# Patient Record
Sex: Male | Born: 1985 | Hispanic: Yes | Marital: Married | Smoking: Former smoker
Health system: Southern US, Community
[De-identification: ages and names within clinical notes are randomized; demographics above are authoritative.]

## PROBLEM LIST (undated history)

## (undated) HISTORY — PX: OSTEOCHONDROMA EXCISION: SHX2137

---

## 2021-02-18 ENCOUNTER — Ambulatory Visit
Admission: RE | Admit: 2021-02-18 | Discharge: 2021-02-18 | Disposition: A | Discharge status: Home / Self Care | Payer: No Typology Code available for payment source | Source: Ambulatory Visit | Attending: Chiropractic Medicine | Admitting: Chiropractic Medicine

## 2021-02-18 ENCOUNTER — Other Ambulatory Visit: Payer: Self-pay | Admitting: Chiropractic Medicine

## 2021-02-18 DIAGNOSIS — R52 Pain, unspecified: Secondary | ICD-10-CM

## 2021-02-21 ENCOUNTER — Other Ambulatory Visit: Payer: Self-pay | Admitting: Chiropractic Medicine

## 2021-02-21 DIAGNOSIS — M545 Low back pain, unspecified: Secondary | ICD-10-CM

## 2021-02-25 ENCOUNTER — Ambulatory Visit
Admission: RE | Admit: 2021-02-25 | Discharge: 2021-02-25 | Disposition: A | Discharge status: Home / Self Care | Payer: Self-pay | Source: Ambulatory Visit | Attending: Chiropractic Medicine | Admitting: Chiropractic Medicine

## 2021-02-25 ENCOUNTER — Other Ambulatory Visit: Payer: Self-pay

## 2021-02-25 DIAGNOSIS — M545 Low back pain, unspecified: Secondary | ICD-10-CM

## 2021-03-29 ENCOUNTER — Ambulatory Visit: Payer: Self-pay | Admitting: Orthopedic Surgery

## 2021-04-09 NOTE — Pre-Procedure Instructions (Signed)
Surgical Instructions ? ? ? Your procedure is scheduled on Wednesday, April 5. ? Report to [facility] Main Entrance "A" at 6:30 A.M., then check in with the Admitting office. ? Call this number if you have problems the morning of surgery: ? [phone] ? ? If you have any questions prior to your surgery date call [phone]: Open Monday-Friday 8am-4pm ? ? ? Remember: ? Do not eat after midnight the night before your surgery ? ?You may drink clear liquids until 5:30 the morning of your surgery.   ?Clear liquids allowed are: Water, Non-Citrus Juices (without pulp), Carbonated Beverages, Clear Tea, Black Coffee ONLY (NO MILK, CREAM OR POWDERED CREAMER of any kind), and Gatorade ?  ? Take these medicines the morning of surgery with A SIP OF WATER:  ? ? ?As of today, STOP taking any Aspirin (unless otherwise instructed by your surgeon) Aleve, Naproxen, Ibuprofen, Motrin, Advil, Goody's, BC's, all herbal medications, fish oil, and all vitamins. ? ?         ?Do not wear jewelry or makeup ?Do not wear lotions, powders, perfumes/colognes, or deodorant. ?Do not shave 48 hours prior to surgery.  Men may shave face and neck. ?Do not bring valuables to the hospital. ?Do not wear nail polish, gel polish, artificial nails, or any other type of covering on natural nails (fingers and toes) ?If you have artificial nails or gel coating that need to be removed by a nail salon, please have this removed prior to surgery. Artificial nails or gel coating may interfere with anesthesia's ability to adequately monitor your vital signs. ? ?[facility] is not responsible for any belongings or valuables. .  ? ?Do NOT Smoke (Tobacco/Vaping)  24 hours prior to your procedure ? ?If you use a CPAP at night, you may bring your mask for your overnight stay. ?  ?Contacts, glasses, hearing aids, dentures or partials may not be worn into surgery, please bring cases for these belongings ?  ?For patients admitted to the hospital, discharge time will be  determined by your treatment team. ?  ?Patients discharged the day of surgery will not be allowed to drive home, and someone needs to stay with them for 24 hours. ? ? ?SURGICAL WAITING ROOM VISITATION ?Patients having surgery or a procedure in a hospital may have two support people. ?Children under the age of 66 must have an adult with them who is not the patient. ?They may stay in the waiting area during the procedure and may switch out with other visitors. If the patient needs to stay at the hospital during part of their recovery, the visitor guidelines for inpatient rooms apply. ? ?Please refer to the [facility] website for the visitor guidelines for Inpatients (after your surgery is over and you are in a regular room).  ? ? ? ? ? ?Special instructions:   ? ?Oral Hygiene is also important to reduce your risk of infection.  Remember - BRUSH YOUR TEETH THE MORNING OF SURGERY WITH YOUR REGULAR TOOTHPASTE ? ? ?[facility]- Preparing For Surgery ? ?Before surgery, you can play an important role. Because skin is not sterile, your skin needs to be as free of germs as possible. You can reduce the number of germs on your skin by washing with CHG (chlorahexidine gluconate) Soap before surgery.  CHG is an antiseptic cleaner which kills germs and bonds with the skin to continue killing germs even after washing.   ? ? ?Please do not use if you have an allergy to  CHG or antibacterial soaps. If your skin becomes reddened/irritated stop using the CHG.  ?Do not shave (including legs and underarms) for at least 48 hours prior to first CHG shower. It is OK to shave your face. ? ?Please follow these instructions carefully. ?  ? ? Shower the NIGHT BEFORE SURGERY and the MORNING OF SURGERY with CHG Soap.  ? If you chose to wash your hair, wash your hair first as usual with your normal shampoo. After you shampoo, rinse your hair and body thoroughly to remove the shampoo.  Then [employer] and genitals (private parts) with your normal  soap and rinse thoroughly to remove soap. ? ?After that Use CHG Soap as you would any other liquid soap. You can apply CHG directly to the skin and wash gently with a scrungie or a clean washcloth.  ? ?Apply the CHG Soap to your body ONLY FROM THE NECK DOWN.  Do not use on open wounds or open sores. Avoid contact with your eyes, ears, mouth and genitals (private parts). Wash Face and genitals (private parts)  with your normal soap.  ? ?Wash thoroughly, paying special attention to the area where your surgery will be performed. ? ?Thoroughly rinse your body with warm water from the neck down. ? ?DO NOT shower/wash with your normal soap after using and rinsing off the CHG Soap. ? ?Pat yourself dry with a CLEAN TOWEL. ? ?Wear CLEAN PAJAMAS to bed the night before surgery ? ?Place CLEAN SHEETS on your bed the night before your surgery ? ?DO NOT SLEEP WITH PETS. ? ? ?Day of Surgery: ? ?Take a shower with CHG soap. ?Wear Clean/Comfortable clothing the morning of surgery ?Do not apply any deodorants/lotions.   ?Remember to brush your teeth WITH YOUR REGULAR TOOTHPASTE. ? ? ? ?If you received a COVID test during your pre-op visit  it is requested that you wear a mask when out in public, stay away from anyone that may not be feeling well and notify your surgeon if you develop symptoms. If you have been in contact with anyone that has tested positive in the last 10 days please notify you surgeon. ? ?  ?Please read over the following fact sheets that you were given.  ? ?

## 2021-04-10 ENCOUNTER — Inpatient Hospital Stay (HOSPITAL_COMMUNITY)
Admission: RE | Admit: 2021-04-10 | Discharge: 2021-04-10 | Disposition: A | Discharge status: Home / Self Care | Payer: No Typology Code available for payment source | Source: Ambulatory Visit

## 2021-04-10 NOTE — Progress Notes (Signed)
Patient didn't come for PAT appointment this morning at 09:00 o'clock. This Probation officer called the patient but he can't speak very well [place]. He was at home at 09:25 o'clock. Dr. Rolena Infante office was called and notified. Our surgical scheduler was notified.  ?

## 2021-04-10 NOTE — Pre-Procedure Instructions (Addendum)
? Your procedure is scheduled on Wednesday, April 5. ? ? Report to [facility] Main Entrance "A" at 6:30 A.M., then check in with the Admitting office. ? Call this number if you have problems the morning of surgery: ? [phone] ? ? If you have any questions prior to your surgery date call [phone]: Open Monday-Friday 8am-4pm ? ? ? Remember: ? Do not eat after midnight the night before your surgery ? ?You may drink clear liquids until 5:30 the morning of your surgery.   ?Clear liquids allowed are: Water, Non-Citrus Juices (without pulp), Carbonated Beverages, Clear Tea, Black Coffee ONLY (NO MILK, CREAM OR POWDERED CREAMER of any kind), and Gatorade ?  ? Take these medicines the morning of surgery with A SIP OF WATER: NONE ? ? ?As of today, STOP taking any Aspirin (unless otherwise instructed by your surgeon) Aleve, Naproxen, Ibuprofen, Motrin, Advil, Goody's, BC's, all herbal medications, fish oil, and all vitamins. ? ? ? The day of surgery: ?         ?Do not wear jewelry ?Do not wear lotions, powders, colognes, or deodorant. ?Men may shave face and neck. ?Do not bring valuables to the hospital. ? ? ?[facility] is not responsible for any belongings or valuables. .  ? ?Do NOT Smoke (Tobacco/Vaping)  24 hours prior to your procedure ? ?If you use a CPAP at night, you may bring your mask for your overnight stay. ?  ?Contacts, glasses, hearing aids, dentures or partials may not be worn into surgery, please bring cases for these belongings ?  ?For patients admitted to the hospital, discharge time will be determined by your treatment team. ?  ?Patients discharged the day of surgery will not be allowed to drive home, and someone needs to stay with them for 24 hours. ? ? ?SURGICAL WAITING ROOM VISITATION ?Patients having surgery or a procedure in a hospital may have two support people. ?Children under the age of 65 must have an adult with them who is not the patient. ?They may stay in the waiting area during the  procedure and may switch out with other visitors. If the patient needs to stay at the hospital during part of their recovery, the visitor guidelines for inpatient rooms apply. ? ?Please refer to the [facility] website for the visitor guidelines for Inpatients (after your surgery is over and you are in a regular room).  ? ? ?Special instructions:   ? ?Oral Hygiene is also important to reduce your risk of infection.  Remember - BRUSH YOUR TEETH THE MORNING OF SURGERY WITH YOUR REGULAR TOOTHPASTE ? ? ?[facility]- Preparing For Surgery ? ?Before surgery, you can play an important role. Because skin is not sterile, your skin needs to be as free of germs as possible. You can reduce the number of germs on your skin by washing with CHG (chlorahexidine gluconate) Soap before surgery.  CHG is an antiseptic cleaner which kills germs and bonds with the skin to continue killing germs even after washing.   ? ? ?Please do not use if you have an allergy to CHG or antibacterial soaps. If your skin becomes reddened/irritated stop using the CHG.  ?Do not shave (including legs and underarms) for at least 48 hours prior to first CHG shower. It is OK to shave your face. ? ?Please follow these instructions carefully. ?  ? ? Shower the NIGHT BEFORE SURGERY and the MORNING OF SURGERY with CHG Soap.  ? If you chose to wash your hair, wash  your hair first as usual with your normal shampoo. After you shampoo, rinse your hair and body thoroughly to remove the shampoo.  Then [employer] and genitals (private parts) with your normal soap and rinse thoroughly to remove soap. ? ?After that Use CHG Soap as you would any other liquid soap. You can apply CHG directly to the skin and wash gently with a scrungie or a clean washcloth.  ? ?Apply the CHG Soap to your body ONLY FROM THE NECK DOWN.  Do not use on open wounds or open sores. Avoid contact with your eyes, ears, mouth and genitals (private parts). Wash Face and genitals (private parts)  with your  normal soap.  ? ?Wash thoroughly, paying special attention to the area where your surgery will be performed. ? ?Thoroughly rinse your body with warm water from the neck down. ? ?DO NOT shower/wash with your normal soap after using and rinsing off the CHG Soap. ? ?Pat yourself dry with a CLEAN TOWEL. ? ?Wear CLEAN PAJAMAS to bed the night before surgery ? ?Place CLEAN SHEETS on your bed the night before your surgery ? ?DO NOT SLEEP WITH PETS. ? ? ?Day of Surgery: ? ?Take a shower with CHG soap. ?Wear Clean/Comfortable clothing the morning of surgery ?Do not apply any deodorants/lotions.   ?Remember to brush your teeth WITH YOUR REGULAR TOOTHPASTE. ? ? ? ?If you received a COVID test during your pre-op visit  it is requested that you wear a mask when out in public, stay away from anyone that may not be feeling well and notify your surgeon if you develop symptoms. If you have been in contact with anyone that has tested positive in the last 10 days please notify you surgeon. ? ?  ?Please read over the following fact sheets that you were given.  ? ?

## 2021-04-13 ENCOUNTER — Other Ambulatory Visit (HOSPITAL_COMMUNITY)
Admission: RE | Admit: 2021-04-13 | Discharge: 2021-04-13 | Disposition: A | Discharge status: Home / Self Care | Payer: No Typology Code available for payment source | Source: Ambulatory Visit

## 2021-04-15 ENCOUNTER — Other Ambulatory Visit: Payer: Self-pay

## 2021-04-15 ENCOUNTER — Encounter (HOSPITAL_COMMUNITY): Payer: Self-pay

## 2021-04-15 ENCOUNTER — Encounter (HOSPITAL_COMMUNITY)
Admission: RE | Admit: 2021-04-15 | Discharge: 2021-04-15 | Disposition: A | Discharge status: Home / Self Care | Payer: No Typology Code available for payment source | Source: Ambulatory Visit | Attending: Orthopedic Surgery | Admitting: Orthopedic Surgery

## 2021-04-15 VITALS — BP 132/73 | HR 84 | Temp 98.6°F | Resp 17 | Ht 75.0 in | Wt 225.2 lb

## 2021-04-15 DIAGNOSIS — Z01812 Encounter for preprocedural laboratory examination: Secondary | ICD-10-CM | POA: Insufficient documentation

## 2021-04-15 DIAGNOSIS — Z01818 Encounter for other preprocedural examination: Secondary | ICD-10-CM

## 2021-04-15 LAB — SURGICAL PCR SCREEN
MRSA, PCR: NEGATIVE
Staphylococcus aureus: NEGATIVE

## 2021-04-15 LAB — CBC
HCT: 42.8 % (ref 39.0–52.0)
Hemoglobin: 14.2 g/dL (ref 13.0–17.0)
MCH: 30.4 pg (ref 26.0–34.0)
MCHC: 33.2 g/dL (ref 30.0–36.0)
MCV: 91.6 fL (ref 80.0–100.0)
Platelets: 209 10*3/uL (ref 150–400)
RBC: 4.67 MIL/uL (ref 4.22–5.81)
RDW: 12.3 % (ref 11.5–15.5)
WBC: 9.4 10*3/uL (ref 4.0–10.5)
nRBC: 0 % (ref 0.0–0.2)

## 2021-04-15 NOTE — Progress Notes (Signed)
PCP - denies ?Cardiologist - denies ? ?PPM/ICD - n/a ? ?Chest x-ray - n/a ?EKG - n/a ?Stress Test - denies ?ECHO - denies ?Cardiac Cath - denies ? ?Sleep Study - denies ?CPAP - denies ? ?Blood Thinner Instructions: n/a ?Aspirin Instructions: n/a ? ?ERAS Protcol -clear liquids until 0530 DOS ?PRE-SURGERY Ensure or G2- none ordered ? ?COVID TEST- n/a ? ?Anesthesia review: no ? ?Patient denies shortness of breath, fever, cough and chest pain at PAT appointment ? ? ?All instructions explained to the patient, with a verbal understanding of the material. Patient agrees to go over the instructions while at home for a better understanding. Patient also instructed to self quarantine after being tested for COVID-19. The opportunity to ask questions was provided. ? ? ?

## 2021-04-16 NOTE — H&P (Signed)
? ? ?Chief Complaint: ?Back and radicular leg pain ? ?History: ?Eric Washington presents today severe back buttock and bilateral radicular leg pain since 02/12/2021. Patient states their pain level is 8-9/10 which significantly impairs activities of daily living as well as overall quality of life.  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   Patient failed to improve with ESI.  Patient has neurologic deficits and pain and as a result we elected to move forward with surgery ? ?No past medical history on file. ? ?No Known Allergies ? ?No current facility-administered medications on file prior to encounter.  ? ?No current outpatient medications on file prior to encounter.  ? ? ?Physical Exam: ?There were no vitals filed for this visit. ?There is no height or weight on file to calculate BMI. ? ? Fareed is a pleasant individual, who appears younger than their stated age. ? ?He is alert and orientated ?3. ? ?No shortness of breath, chest pain. ? ?Lungs clear to auscultation bilaterally ? ?Cardiac: Regular rate and rhythm. No rubs gallops murmurs. ? ?Abdomen is soft and non-tender, negative loss of bowel and bladder control, no rebound tenderness. ? ?Negative: skin lesions abrasions contusions ? ?Peripheral pulses: 2+ dorsalis pedis/posterior tibialis pulses bilaterally. ?LE compartments are: Soft and nontender. ? ?Gait pattern: Altered gait pattern due to bilateral lower extremity generalized weakness ? ?Assistive devices: Walker ? ?Neuro: In the seated position: Bilateral :5/5 motor strength in the hip flexor, quad, and hamstring. 2/5 EHL/tibialis anterior strength bilaterally. 4/5 gastrocnemius strength bilaterally. Numbness and dysesthesias predominantly in the L3 and L4 dermatome bilaterally. Negative Babinski test, negative straight leg raise test. No clonus. ? ?Musculoskeletal: Moderate to severe back pain radiating into both lower extremities. Limited range of motion of his  lumbar spine due to severe pain. ? ?Image: ? X-rays of the lumbar spine demonstrate normal sagittal alignment with no spondylolisthesis or scoliosis. ? 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 Moderate spinal canal stenosis at L4-5. No fracture or abnormal marrow signal change. ? ? ?A/P: ?Summary: Eric Washington presents today severe back buttock and bilateral radicular leg pain since 02/12/2021. Patient states their pain level is 8-9/10 which significantly impairs activities of daily living as well as overall quality of life. ? ?Diagnosis: Eric Washington is a very pleasant 36 year old gentleman who was in his usual state of good health until he unfortunately injured himself after exercising. Patient presents today because of severe back and radicular bilateral leg pain. He has a interpreter as well as his friend. Patient is a Spanish-speaking individual. ? ?I have gone over the MRI and clinical findings with the patient. He has a large disc herniation at L3-4 which is causing central and lateral recess stenosis affecting the L4 and L5 nerve root. He has neurological deficits on both motor and sensory testing bilaterally in these distributions. Fortunately, he does not show signs or symptoms of a cauda equina syndrome. ? ?Risks and benefits of decompression/discectomy: Infection, bleeding, death, stroke, paralysis, ongoing or worse pain, need for additional surgery, leak of spinal fluid, adjacent segment degeneration requiring additional surgery, post-operative hematoma formation that can result in neurological compromise and the need for urgent/emergent re-operation. Loss in bowel and bladder control. Injury to major vessels that could result in the need for urgent abdominal surgery to stop bleeding. Risk of deep venous thrombosis (DVT) and the need for additional treatment. Recurrent disc herniation resulting in the  need  for revision surgery, which could include fusion surgery (utilizing instrumentation such as pedicle screws and intervertebral cages). ?Additional risk: If instrumentation is used there is a risk of migration, or breakage of that hardware that could require additional surgery. ?

## 2021-04-16 NOTE — Progress Notes (Signed)
LM on machine of time change, arrival 0800  ?

## 2021-04-17 ENCOUNTER — Encounter (HOSPITAL_COMMUNITY)
Admission: RE | Disposition: A | Discharge status: Home / Self Care | Payer: Self-pay | Source: Home / Self Care | Attending: Orthopedic Surgery

## 2021-04-17 ENCOUNTER — Ambulatory Visit (HOSPITAL_BASED_OUTPATIENT_CLINIC_OR_DEPARTMENT_OTHER): Payer: No Typology Code available for payment source | Admitting: Certified Registered Nurse Anesthetist

## 2021-04-17 ENCOUNTER — Encounter (HOSPITAL_COMMUNITY): Payer: Self-pay | Admitting: Orthopedic Surgery

## 2021-04-17 ENCOUNTER — Ambulatory Visit (HOSPITAL_COMMUNITY): Payer: No Typology Code available for payment source

## 2021-04-17 ENCOUNTER — Other Ambulatory Visit: Payer: Self-pay

## 2021-04-17 ENCOUNTER — Observation Stay (HOSPITAL_COMMUNITY)
Admission: RE | Admit: 2021-04-17 | Discharge: 2021-04-18 | Disposition: A | Discharge status: Home / Self Care | Payer: No Typology Code available for payment source | Attending: Orthopedic Surgery | Admitting: Orthopedic Surgery

## 2021-04-17 ENCOUNTER — Ambulatory Visit (HOSPITAL_COMMUNITY): Payer: No Typology Code available for payment source | Admitting: Certified Registered Nurse Anesthetist

## 2021-04-17 DIAGNOSIS — M5116 Intervertebral disc disorders with radiculopathy, lumbar region: Secondary | ICD-10-CM | POA: Insufficient documentation

## 2021-04-17 DIAGNOSIS — M48061 Spinal stenosis, lumbar region without neurogenic claudication: Principal | ICD-10-CM | POA: Diagnosis present

## 2021-04-17 HISTORY — PX: LUMBAR LAMINECTOMY/DECOMPRESSION MICRODISCECTOMY: SHX5026

## 2021-04-17 LAB — HEMOGLOBIN A1C
Hgb A1c MFr Bld: 5.7 % — ABNORMAL HIGH (ref 4.8–5.6)
Mean Plasma Glucose: 116.89 mg/dL

## 2021-04-17 SURGERY — LUMBAR LAMINECTOMY/DECOMPRESSION MICRODISCECTOMY 1 LEVEL
Anesthesia: General | Site: Spine Lumbar

## 2021-04-17 MED ORDER — LIDOCAINE 2% (20 MG/ML) 5 ML SYRINGE
INTRAMUSCULAR | Status: AC
  Filled 2021-04-17: qty 5

## 2021-04-17 MED ORDER — ACETAMINOPHEN 650 MG RE SUPP: 650.0000 mg | RECTAL | Status: DC | PRN

## 2021-04-17 MED ORDER — OXYCODONE HCL 5 MG PO TABS
5.0000 mg | ORAL_TABLET | ORAL | Status: DC | PRN
  Administered 2021-04-18: 5 mg via ORAL
  Filled 2021-04-17: qty 1

## 2021-04-17 MED ORDER — POLYETHYLENE GLYCOL 3350 17 G PO PACK: 17.0000 g | PACK | Freq: Every day | ORAL | Status: DC | PRN

## 2021-04-17 MED ORDER — GABAPENTIN 300 MG PO CAPS
300.0000 mg | ORAL_CAPSULE | Freq: Three times a day (TID) | ORAL | Status: DC
  Administered 2021-04-17 – 2021-04-18 (×3): 300 mg via ORAL
  Filled 2021-04-17 (×3): qty 1

## 2021-04-17 MED ORDER — ACETAMINOPHEN 160 MG/5ML PO SOLN: 1000.0000 mg | Freq: Once | ORAL | Status: DC | PRN

## 2021-04-17 MED ORDER — FLEET ENEMA 7-19 GM/118ML RE ENEM: 1.0000 | ENEMA | Freq: Once | RECTAL | Status: DC | PRN

## 2021-04-17 MED ORDER — MIDAZOLAM HCL 2 MG/2ML IJ SOLN
INTRAMUSCULAR | Status: DC | PRN
  Administered 2021-04-17: 2 mg via INTRAVENOUS

## 2021-04-17 MED ORDER — CEFAZOLIN SODIUM-DEXTROSE 2-4 GM/100ML-% IV SOLN
2.0000 g | INTRAVENOUS | Status: AC
  Administered 2021-04-17: 2 g via INTRAVENOUS

## 2021-04-17 MED ORDER — HYDROMORPHONE HCL 1 MG/ML IJ SOLN: 1.0000 mg | INTRAMUSCULAR | Status: DC | PRN

## 2021-04-17 MED ORDER — ORAL CARE MOUTH RINSE: 15.0000 mL | Freq: Once | OROMUCOSAL | Status: DC

## 2021-04-17 MED ORDER — ROCURONIUM BROMIDE 10 MG/ML (PF) SYRINGE
PREFILLED_SYRINGE | INTRAVENOUS | Status: AC
  Filled 2021-04-17: qty 10

## 2021-04-17 MED ORDER — CHLORHEXIDINE GLUCONATE 0.12 % MT SOLN: 15.0000 mL | Freq: Once | OROMUCOSAL | Status: DC

## 2021-04-17 MED ORDER — DEXAMETHASONE 4 MG PO TABS
4.0000 mg | ORAL_TABLET | Freq: Four times a day (QID) | ORAL | Status: DC
  Administered 2021-04-17: 4 mg via ORAL
  Filled 2021-04-17: qty 1

## 2021-04-17 MED ORDER — PHENOL 1.4 % MT LIQD: 1.0000 | OROMUCOSAL | Status: DC | PRN

## 2021-04-17 MED ORDER — FENTANYL CITRATE (PF) 250 MCG/5ML IJ SOLN
INTRAMUSCULAR | Status: DC | PRN
  Administered 2021-04-17: 100 ug via INTRAVENOUS
  Administered 2021-04-17 (×3): 50 ug via INTRAVENOUS

## 2021-04-17 MED ORDER — DEXAMETHASONE SODIUM PHOSPHATE 10 MG/ML IJ SOLN
INTRAMUSCULAR | Status: AC
  Filled 2021-04-17: qty 1

## 2021-04-17 MED ORDER — LIDOCAINE 2% (20 MG/ML) 5 ML SYRINGE
INTRAMUSCULAR | Status: DC | PRN
Start: 2021-04-17 — End: 2021-04-17
  Administered 2021-04-17: 60 mg via INTRAVENOUS

## 2021-04-17 MED ORDER — ROCURONIUM BROMIDE 10 MG/ML (PF) SYRINGE
PREFILLED_SYRINGE | INTRAVENOUS | Status: DC | PRN
Start: 2021-04-17 — End: 2021-04-17
  Administered 2021-04-17: 80 mg via INTRAVENOUS
  Administered 2021-04-17 (×2): 20 mg via INTRAVENOUS

## 2021-04-17 MED ORDER — DEXAMETHASONE SODIUM PHOSPHATE 10 MG/ML IJ SOLN
INTRAMUSCULAR | Status: DC | PRN
  Administered 2021-04-17: 10 mg via INTRAVENOUS

## 2021-04-17 MED ORDER — ACETAMINOPHEN 325 MG PO TABS
650.0000 mg | ORAL_TABLET | ORAL | Status: DC | PRN
  Administered 2021-04-17 – 2021-04-18 (×2): 650 mg via ORAL
  Filled 2021-04-17 (×3): qty 2

## 2021-04-17 MED ORDER — SODIUM CHLORIDE 0.9% FLUSH
3.0000 mL | Freq: Two times a day (BID) | INTRAVENOUS | Status: DC
  Administered 2021-04-17: 3 mL via INTRAVENOUS

## 2021-04-17 MED ORDER — 0.9 % SODIUM CHLORIDE (POUR BTL) OPTIME
TOPICAL | Status: DC | PRN
  Administered 2021-04-17 (×2): 1000 mL

## 2021-04-17 MED ORDER — INSULIN ASPART 100 UNIT/ML IJ SOLN: 0.0000 [IU] | Freq: Every day | INTRAMUSCULAR | Status: DC

## 2021-04-17 MED ORDER — BUPIVACAINE-EPINEPHRINE (PF) 0.25% -1:200000 IJ SOLN
INTRAMUSCULAR | Status: AC
  Filled 2021-04-17: qty 30

## 2021-04-17 MED ORDER — ONDANSETRON HCL 4 MG PO TABS: 4.0000 mg | ORAL_TABLET | Freq: Four times a day (QID) | ORAL | Status: DC | PRN

## 2021-04-17 MED ORDER — ONDANSETRON HCL 4 MG/2ML IJ SOLN: 4.0000 mg | Freq: Four times a day (QID) | INTRAMUSCULAR | Status: DC | PRN

## 2021-04-17 MED ORDER — ACETAMINOPHEN 10 MG/ML IV SOLN: 1000.0000 mg | Freq: Once | INTRAVENOUS | Status: DC | PRN

## 2021-04-17 MED ORDER — INSULIN ASPART 100 UNIT/ML IJ SOLN: 0.0000 [IU] | Freq: Three times a day (TID) | INTRAMUSCULAR | Status: DC

## 2021-04-17 MED ORDER — PROPOFOL 10 MG/ML IV BOLUS
INTRAVENOUS | Status: AC
  Filled 2021-04-17: qty 20

## 2021-04-17 MED ORDER — OXYCODONE HCL 5 MG PO TABS
10.0000 mg | ORAL_TABLET | ORAL | Status: DC | PRN
  Administered 2021-04-17 – 2021-04-18 (×5): 10 mg via ORAL
  Filled 2021-04-17 (×5): qty 2

## 2021-04-17 MED ORDER — LACTATED RINGERS IV SOLN: INTRAVENOUS | Status: DC

## 2021-04-17 MED ORDER — CEFAZOLIN SODIUM-DEXTROSE 2-4 GM/100ML-% IV SOLN
INTRAVENOUS | Status: AC
  Filled 2021-04-17: qty 100

## 2021-04-17 MED ORDER — CEFAZOLIN SODIUM-DEXTROSE 1-4 GM/50ML-% IV SOLN
1.0000 g | Freq: Three times a day (TID) | INTRAVENOUS | Status: AC
  Administered 2021-04-17 – 2021-04-18 (×2): 1 g via INTRAVENOUS
  Filled 2021-04-17 (×2): qty 50

## 2021-04-17 MED ORDER — DEXTROSE 5 % IV SOLN
500.0000 mg | Freq: Four times a day (QID) | INTRAVENOUS | Status: DC | PRN
  Filled 2021-04-17: qty 5

## 2021-04-17 MED ORDER — BUPIVACAINE-EPINEPHRINE 0.25% -1:200000 IJ SOLN
INTRAMUSCULAR | Status: DC | PRN
  Administered 2021-04-17: 10 mL

## 2021-04-17 MED ORDER — FENTANYL CITRATE (PF) 100 MCG/2ML IJ SOLN
25.0000 ug | INTRAMUSCULAR | Status: DC | PRN
  Administered 2021-04-17: 50 ug via INTRAVENOUS

## 2021-04-17 MED ORDER — LACTATED RINGERS IV SOLN
INTRAVENOUS | Status: DC
Start: 2021-04-17 — End: 2021-04-17

## 2021-04-17 MED ORDER — SODIUM CHLORIDE 0.9 % IV SOLN: 250.0000 mL | INTRAVENOUS | Status: DC

## 2021-04-17 MED ORDER — TRANEXAMIC ACID 1000 MG/10ML IV SOLN
2000.0000 mg | Freq: Once | INTRAVENOUS | Status: AC
  Administered 2021-04-17: 2000 mg via TOPICAL
  Filled 2021-04-17: qty 20

## 2021-04-17 MED ORDER — ONDANSETRON HCL 4 MG/2ML IJ SOLN
INTRAMUSCULAR | Status: AC
  Filled 2021-04-17: qty 2

## 2021-04-17 MED ORDER — OXYCODONE HCL 5 MG PO TABS: 5.0000 mg | ORAL_TABLET | Freq: Once | ORAL | Status: DC | PRN

## 2021-04-17 MED ORDER — FENTANYL CITRATE (PF) 250 MCG/5ML IJ SOLN
INTRAMUSCULAR | Status: AC
  Filled 2021-04-17: qty 5

## 2021-04-17 MED ORDER — ACETAMINOPHEN 10 MG/ML IV SOLN
INTRAVENOUS | Status: AC
  Filled 2021-04-17: qty 100

## 2021-04-17 MED ORDER — SUGAMMADEX SODIUM 200 MG/2ML IV SOLN
INTRAVENOUS | Status: DC | PRN
  Administered 2021-04-17: 200 mg via INTRAVENOUS

## 2021-04-17 MED ORDER — THROMBIN 20000 UNITS EX SOLR
CUTANEOUS | Status: AC
  Filled 2021-04-17: qty 20000

## 2021-04-17 MED ORDER — ACETAMINOPHEN 10 MG/ML IV SOLN
INTRAVENOUS | Status: DC | PRN
  Administered 2021-04-17: 1000 mg via INTRAVENOUS

## 2021-04-17 MED ORDER — ACETAMINOPHEN 500 MG PO TABS: 1000.0000 mg | ORAL_TABLET | Freq: Once | ORAL | Status: DC | PRN

## 2021-04-17 MED ORDER — FENTANYL CITRATE (PF) 100 MCG/2ML IJ SOLN
INTRAMUSCULAR | Status: AC
  Filled 2021-04-17: qty 2

## 2021-04-17 MED ORDER — ONDANSETRON HCL 4 MG PO TABS: 4.0000 mg | ORAL_TABLET | Freq: Three times a day (TID) | ORAL | 0 refills | Status: AC | PRN

## 2021-04-17 MED ORDER — MENTHOL 3 MG MT LOZG: 1.0000 | LOZENGE | OROMUCOSAL | Status: DC | PRN

## 2021-04-17 MED ORDER — METHOCARBAMOL 500 MG PO TABS
500.0000 mg | ORAL_TABLET | Freq: Four times a day (QID) | ORAL | Status: DC | PRN
Start: 2021-04-17 — End: 2021-04-18
  Administered 2021-04-17 – 2021-04-18 (×3): 500 mg via ORAL
  Filled 2021-04-17 (×3): qty 1

## 2021-04-17 MED ORDER — OXYCODONE HCL 5 MG/5ML PO SOLN: 5.0000 mg | Freq: Once | ORAL | Status: DC | PRN

## 2021-04-17 MED ORDER — PROPOFOL 10 MG/ML IV BOLUS
INTRAVENOUS | Status: DC | PRN
  Administered 2021-04-17: 180 mg via INTRAVENOUS

## 2021-04-17 MED ORDER — DEXAMETHASONE SODIUM PHOSPHATE 4 MG/ML IJ SOLN
4.0000 mg | Freq: Four times a day (QID) | INTRAMUSCULAR | Status: DC
  Administered 2021-04-18 (×2): 4 mg via INTRAVENOUS
  Filled 2021-04-17 (×2): qty 1

## 2021-04-17 MED ORDER — SODIUM CHLORIDE 0.9% FLUSH: 3.0000 mL | INTRAVENOUS | Status: DC | PRN

## 2021-04-17 MED ORDER — CHLORHEXIDINE GLUCONATE 0.12 % MT SOLN
OROMUCOSAL | Status: AC
  Administered 2021-04-17: 15 mL
  Filled 2021-04-17: qty 15

## 2021-04-17 MED ORDER — METHOCARBAMOL 500 MG PO TABS
500.0000 mg | ORAL_TABLET | Freq: Three times a day (TID) | ORAL | 0 refills | Status: AC | PRN
Start: 2021-04-17 — End: 2021-04-22

## 2021-04-17 MED ORDER — GELATIN ABSORBABLE 100 EX MISC
CUTANEOUS | Status: DC | PRN
  Administered 2021-04-17: 20 mL via TOPICAL

## 2021-04-17 MED ORDER — SURGIFLO WITH THROMBIN (HEMOSTATIC MATRIX KIT) OPTIME
TOPICAL | Status: DC | PRN
  Administered 2021-04-17 (×2): 1 via TOPICAL

## 2021-04-17 MED ORDER — OXYCODONE-ACETAMINOPHEN 10-325 MG PO TABS: 1.0000 | ORAL_TABLET | Freq: Four times a day (QID) | ORAL | 0 refills | Status: AC | PRN

## 2021-04-17 MED ORDER — ONDANSETRON HCL 4 MG/2ML IJ SOLN
INTRAMUSCULAR | Status: DC | PRN
  Administered 2021-04-17: 4 mg via INTRAVENOUS

## 2021-04-17 MED ORDER — MIDAZOLAM HCL 2 MG/2ML IJ SOLN
INTRAMUSCULAR | Status: AC
  Filled 2021-04-17: qty 2

## 2021-04-17 SURGICAL SUPPLY — 58 items
BAG COUNTER SPONGE SURGICOUNT (BAG) ×2 IMPLANT
BENZOIN TINCTURE PRP APPL 2/3 (GAUZE/BANDAGES/DRESSINGS) ×1 IMPLANT
BNDG GAUZE ELAST 4 BULKY (GAUZE/BANDAGES/DRESSINGS) ×2 IMPLANT
BUR EGG ELITE 4.0 (BURR) IMPLANT
BUR MATCHSTICK NEURO 3.0 LAGG (BURR) IMPLANT
CANISTER SUCT 3000ML PPV (MISCELLANEOUS) ×2 IMPLANT
CLSR STERI-STRIP ANTIMIC 1/2X4 (GAUZE/BANDAGES/DRESSINGS) ×2 IMPLANT
CORD BIPOLAR FORCEPS 12FT (ELECTRODE) ×1 IMPLANT
COVER SURGICAL LIGHT HANDLE (MISCELLANEOUS) ×2 IMPLANT
DRAIN CHANNEL 15F RND FF W/TCR (WOUND CARE) IMPLANT
DRAPE POUCH INSTRU U-SHP 10X18 (DRAPES) ×2 IMPLANT
DRAPE SURG 17X23 STRL (DRAPES) ×2 IMPLANT
DRAPE U-SHAPE 47X51 STRL (DRAPES) ×2 IMPLANT
DRSG OPSITE POSTOP 3X4 (GAUZE/BANDAGES/DRESSINGS) ×2 IMPLANT
DRSG OPSITE POSTOP 4X6 (GAUZE/BANDAGES/DRESSINGS) ×1 IMPLANT
DURAPREP 26ML APPLICATOR (WOUND CARE) ×2 IMPLANT
ELECT BLADE 4.0 EZ CLEAN MEGAD (MISCELLANEOUS)
ELECT CAUTERY BLADE 6.4 (BLADE) ×2 IMPLANT
ELECT PENCIL ROCKER SW 15FT (MISCELLANEOUS) ×2 IMPLANT
ELECT REM PT RETURN 9FT ADLT (ELECTROSURGICAL) ×2
ELECTRODE BLDE 4.0 EZ CLN MEGD (MISCELLANEOUS) IMPLANT
ELECTRODE REM PT RTRN 9FT ADLT (ELECTROSURGICAL) ×1 IMPLANT
EVACUATOR SILICONE 100CC (DRAIN) IMPLANT
GLOVE SURG ENC MOIS LTX SZ6.5 (GLOVE) ×2 IMPLANT
GLOVE SURG MICRO LTX SZ8.5 (GLOVE) ×2 IMPLANT
GLOVE SURG UNDER POLY LF SZ6.5 (GLOVE) ×2 IMPLANT
GLOVE SURG UNDER POLY LF SZ8.5 (GLOVE) ×2 IMPLANT
GOWN STRL REUS W/ TWL LRG LVL3 (GOWN DISPOSABLE) ×2 IMPLANT
GOWN STRL REUS W/TWL 2XL LVL3 (GOWN DISPOSABLE) ×2 IMPLANT
GOWN STRL REUS W/TWL LRG LVL3 (GOWN DISPOSABLE) ×2
KIT BASIN OR (CUSTOM PROCEDURE TRAY) ×2 IMPLANT
KIT TURNOVER KIT B (KITS) ×2 IMPLANT
NDL SPNL 18GX3.5 QUINCKE PK (NEEDLE) ×2 IMPLANT
NEEDLE 22X1 1/2 (OR ONLY) (NEEDLE) ×2 IMPLANT
NEEDLE SPNL 18GX3.5 QUINCKE PK (NEEDLE) ×4 IMPLANT
NS IRRIG 1000ML POUR BTL (IV SOLUTION) ×2 IMPLANT
PACK LAMINECTOMY ORTHO (CUSTOM PROCEDURE TRAY) ×2 IMPLANT
PACK UNIVERSAL I (CUSTOM PROCEDURE TRAY) ×2 IMPLANT
PAD ARMBOARD 7.5X6 YLW CONV (MISCELLANEOUS) ×5 IMPLANT
PATTIES SURGICAL .5 X.5 (GAUZE/BANDAGES/DRESSINGS) ×2 IMPLANT
PATTIES SURGICAL .5 X1 (DISPOSABLE) ×2 IMPLANT
SPONGE SURGIFOAM ABS GEL 100 (HEMOSTASIS) IMPLANT
SPONGE T-LAP 4X18 ~~LOC~~+RFID (SPONGE) ×3 IMPLANT
STRIP CLOSURE SKIN 1/2X4 (GAUZE/BANDAGES/DRESSINGS) ×1 IMPLANT
SURGIFLO W/THROMBIN 8M KIT (HEMOSTASIS) ×1 IMPLANT
SUT BONE WAX W31G (SUTURE) ×1 IMPLANT
SUT MNCRL AB 3-0 PS2 27 (SUTURE) ×2 IMPLANT
SUT VIC AB 0 CT1 27 (SUTURE)
SUT VIC AB 0 CT1 27XBRD ANBCTR (SUTURE) IMPLANT
SUT VIC AB 1 CT1 18XCR BRD 8 (SUTURE) ×1 IMPLANT
SUT VIC AB 1 CT1 8-18 (SUTURE) ×1
SUT VIC AB 2-0 CT1 18 (SUTURE) ×2 IMPLANT
SYR BULB IRRIG 60ML STRL (SYRINGE) ×2 IMPLANT
SYR CONTROL 10ML LL (SYRINGE) ×2 IMPLANT
TOWEL GREEN STERILE (TOWEL DISPOSABLE) ×2 IMPLANT
TOWEL GREEN STERILE FF (TOWEL DISPOSABLE) ×2 IMPLANT
WATER STERILE IRR 1000ML POUR (IV SOLUTION) ×1 IMPLANT
YANKAUER SUCT BULB TIP NO VENT (SUCTIONS) ×1 IMPLANT

## 2021-04-17 NOTE — Anesthesia Preprocedure Evaluation (Signed)
Anesthesia Evaluation  ?Patient identified by MRN, date of birth, ID band ?Patient awake ? ? ? ?Reviewed: ?Allergy & Precautions, NPO status , Patient's Chart, lab work & pertinent test results ? ?History of Anesthesia Complications ?Negative for: history of anesthetic complications ? ?Airway ?Mallampati: II ? ?TM Distance: >3 FB ? ? ? ? Dental ? ?(+) Teeth Intact, Chipped, Dental Advisory Given,  ?  ?Pulmonary ?neg shortness of breath, neg sleep apnea, neg COPD, neg recent URI, former smoker,  ?  ?breath sounds clear to auscultation ? ? ? ? ? ? Cardiovascular ?negative cardio ROS ? ? ?Rhythm:Regular  ? ?  ?Neuro/Psych ?negative psych ROS  ? GI/Hepatic ?negative GI ROS, Neg liver ROS,   ?Endo/Other  ?negative endocrine ROS ? Renal/GU ?negative Renal ROS  ? ?  ?Musculoskeletal ?negative musculoskeletal ROS ?(+)  ? Abdominal ?  ?Peds ? Hematology ?negative hematology ROS ?(+)   ?Anesthesia Other Findings ? ? Reproductive/Obstetrics ? ?  ? ? ? ? ? ? ? ? ? ? ? ? ? ?  ?  ? ? ? ? ? ? ? ? ?Anesthesia Physical ?Anesthesia Plan ? ?ASA: 1 ? ?Anesthesia Plan: General  ? ?Post-op Pain Management: Toradol IV (intra-op)* and Ofirmev IV (intra-op)*  ? ?Induction: Intravenous ? ?PONV Risk Score and Plan: 2 and Ondansetron and Dexamethasone ? ?Airway Management Planned: Oral ETT ? ?Additional Equipment: None ? ?Intra-op Plan:  ? ?Post-operative Plan: Extubation in OR ? ?Informed Consent: I have reviewed the patients History and Physical, chart, labs and discussed the procedure including the risks, benefits and alternatives for the proposed anesthesia with the patient or authorized representative who has indicated his/her understanding and acceptance.  ? ? ? ?Dental advisory given and Interpreter used for interveiw ? ?Plan Discussed with: CRNA ? ?Anesthesia Plan Comments:   ? ? ? ? ? ? ?Anesthesia Quick Evaluation ? ?

## 2021-04-17 NOTE — Anesthesia Postprocedure Evaluation (Signed)
Anesthesia Post Note ? ?Patient: Eric Washington ? ?Procedure(s) Performed: Lumbar three - four decompression (Spine Lumbar) ? ?  ? ?Patient location during evaluation: PACU ?Anesthesia Type: General ?Level of consciousness: awake and alert ?Pain management: pain level controlled ?Vital Signs Assessment: post-procedure vital signs reviewed and stable ?Respiratory status: spontaneous breathing, nonlabored ventilation, respiratory function stable and patient connected to nasal cannula oxygen ?Cardiovascular status: blood pressure returned to baseline and stable ?Postop Assessment: no apparent nausea or vomiting ?Anesthetic complications: no ? ? ?No notable events documented. ? ?Last Vitals:  ?Vitals:  ? 04/17/21 1359 04/17/21 1617  ?BP: 124/72 120/67  ?Pulse: 81 96  ?Resp: 18 16  ?Temp: 36.7 ?C 36.5 ?C  ?SpO2: 98% 99%  ?  ?Last Pain:  ?Vitals:  ? 04/17/21 1617  ?TempSrc: Oral  ?PainSc:   ? ? ?  ?  ?  ?  ?  ?  ? ?[name] ? ? ? ? ?

## 2021-04-17 NOTE — Transfer of Care (Signed)
Immediate Anesthesia Transfer of Care Note ? ?Patient: Eric Washington ? ?Procedure(s) Performed: Lumbar three - four decompression (Spine Lumbar) ? ?Patient Location: PACU ? ?Anesthesia Type:General ? ?Level of Consciousness: drowsy ? ?Airway & Oxygen Therapy: Patient Spontanous Breathing and Patient connected to face mask oxygen ? ?Post-op Assessment: Report given to RN and Post -op Vital signs reviewed and stable ? ?Post vital signs: Reviewed and stable ? ?Last Vitals:  ?Vitals Value Taken Time  ?BP 118/78 04/17/21 1223  ?Temp    ?Pulse 96 04/17/21 1225  ?Resp 19 04/17/21 1225  ?SpO2 97 % 04/17/21 1225  ?Vitals shown include unvalidated device data. ? ?Last Pain:  ?Vitals:  ? 04/17/21 0725  ?TempSrc:   ?PainSc: 1   ?   ? ?Patients Stated Pain Goal: 3 (04/17/21 0725) ? ?Complications: No notable events documented. ?

## 2021-04-17 NOTE — Anesthesia Procedure Notes (Signed)
Procedure Name: Intubation ?Date/Time: 04/17/2021 9:52 AM ?Performed by: Carolan Clines, CRNA ?Pre-anesthesia Checklist: Patient identified, Emergency Drugs available, Suction available and Patient being monitored ?Patient Re-evaluated:Patient Re-evaluated prior to induction ?Oxygen Delivery Method: Circle System Utilized ?Preoxygenation: Pre-oxygenation with 100% oxygen ?Induction Type: IV induction ?Ventilation: Mask ventilation without difficulty ?Laryngoscope Size: Mac and 4 ?Grade View: Grade I ?Tube type: Oral ?Tube size: 7.5 mm ?Number of attempts: 1 ?Airway Equipment and Method: Stylet ?Placement Confirmation: ETT inserted through vocal cords under direct vision, positive ETCO2 and breath sounds checked- equal and bilateral ?Secured at: 23 cm ?Tube secured with: Tape ?Dental Injury: Teeth and Oropharynx as per pre-operative assessment  ? ? ? ? ?

## 2021-04-17 NOTE — Op Note (Signed)
OPERATIVE REPORT ? ?DATE OF SURGERY: 04/17/2021 ? ?PATIENT NAME:  Eric Washington ?MRN: [id number] ?DOB: September 09, 1985 ? ?PCP: Pcp, No ? ?PRE-OPERATIVE DIAGNOSIS: L3-4 severe spinal stenosis with radiculopathy (motor deficits left lower extremity) ? ?POST-OPERATIVE DIAGNOSIS: Same ? ?PROCEDURE:   ?L3-4 decompression with resection of hard disc osteophyte ?Bilateral medial facetectomy L3-4 with L4 and L3 foraminotomies ? ?SURGEON:  Venita Lick, MD ? ?PHYSICIAN ASSISTANT: None ? ?ANESTHESIA:   General ? ?EBL: 150 ml  ? ?Complications: None ? ?BRIEF HISTORY: ?Eric Washington is a 36 y.o. male who presented to my office with complaints of severe back buttock and radicular left leg pain.  Patient was noted to have weakness requiring him to use a cane to ambulate.  Imaging studies demonstrated severe spinal stenosis due to a central hard disc osteophyte producing central and lateral recess stenosis.  Patient had a epidural injection but notes improvement with his pain or weakness.  As result of the neurological deficits and severe pain we elected to move forward with surgery.  Surgical plan was a lumbar decompression L3-4 with resection of the hard disc osteophyte.  All appropriate risks, benefits, and alternatives to surgery were discussed with the patient and consent was obtained ? ?PROCEDURE DETAILS: ?Patient was brought into the operating room and was properly positioned on the operating room table.  After induction with general anesthesia the patient was endotracheally intubated.  A timeout was taken to confirm all important data: including patient, procedure, and the level. Teds, SCD's were applied.  ? ?Patient was turned prone onto the Wilson frame and all bony prominences were well-padded.  The back was prepped and draped in a standard fashion.  2 needles were placed into the back and an x-ray was taken to confirm that I was at the appropriate level.  I marked out my incision site centered over the L3-4 disc space  infiltrated with quarter percent Marcaine with epinephrine.  Midline incision was made and I closed the deep fascia.  The deep fascia was sharply incised and I stripped the paraspinal muscles to expose the L3 and L4 spinous process, lamina and the L3-4 facet complex.  Once I had the posterior exposure complete I used my bipolar electrocautery to obtain hemostasis.  A Penfield 4 was placed underneath the L3 lamina and an x-ray was taken and read by the radiologist confirming that I was at the appropriate level. ? ?Using a double-action Leksell rongeur I resected the superior third of the L4 spinous process and the inferior two thirds of the L3 spinous process.  I then used pituitary rongeurs to remove the remaining tissue to expose the ligamentum flavum.  Using an field for I dissected through the central raphae of the ligamentum flavum and developed a plane between the ligamentum flavum and the thecal sac.  Once I developed this plane I used my 2 and 3 mm Kerrison rongeur to resect the central portion of the ligamentum flavum.  I then continued resecting ligamentum flavum and resecting the ligamentum flavum bilaterally.  Once I had a bilateral hemilaminotomies complete I was able to gently mobilize the thecal sac and expose the lateral recess.  Once I developed a plane between the nerve root in the lateral recess I used my 2 mm Kerrison rongeur to perform a medial facetectomy.  The epidural veins were identified and coagulated.  I then continued my lateral recess decompression up to the L3 foramen.  I was able to take the 2 mm Kerrison rongeur out the L3 foramen  and further decompress this area.  I was then able to trace the L4 nerve root into the L4 foramen and continue my decompression into the L4 foramen.  I was able to visualize the medial border of the pedicle confirming an adequate lateral recess decompression.  I then went to the contralateral side and using the same technique performed a medial facetectomy  and foraminotomies of L3 and L4.  At this point time I could easily pass my nerve hook superiorly and inferiorly in the lateral recess and underneath the remaining portion of the spinous process of L3.  I had adequately decompressed the central region. ? ?I then gently mobilized the thecal sac on the left-hand side to expose the annulus.  There was a large hard disc osteophyte that I was able to use my Epstein curette to debride.  Great care was taken not to be very aggressive taking the stab for fear of a ventral surface durotomy.  At this point the thecal sac and nerve root in the lateral recess was completely decompressed bilaterally.  Hemostasis was obtained using proper electrocautery, topical TXA and Floseal.  After final irrigation I confirmed once again I had satisfactory decompression by passing my nerve hook superiorly inferiorly and medially bilaterally.  I decompressed the lateral recess and centrally.  I took a final x-ray to confirm that the area of my decompression spanned the area of maximum stenosis on the preoperative MRI. ? ?Once this was confirmed I remove the retractor and closed the wound in a layered fashion with interrupted #1 Vicryl suture, 2-0 Vicryl suture, and a running 3-0 Monocryl for the skin.  Steri-Strips and dry dressings were applied and the patient was ultimately extubated transfer the PACU without incident.  The end of the case all needle and sponge counts were correct.  There were no adverse intraoperative events. ? ?Venita Lick, MD ?04/17/2021 ?12:15 PM ? ? ?

## 2021-04-17 NOTE — Brief Op Note (Signed)
04/17/2021 ? ?12:25 PM ? ?PATIENT:  Eric Washington  36 y.o. male ? 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 ?PROCEDURE:  Procedure(s) with comments: ?Lumbar three - four decompression (N/A) - 3 C-Bed ?2.5 hrs ? ?SURGEON:  Surgeon(s) and Role: ?   Venita Lick, MD - Primary ? ?PHYSICIAN ASSISTANT:  ? ?ASSISTANTS: none  ? ?ANESTHESIA:   general ? ?EBL:  150 mL  ? ?BLOOD ADMINISTERED:none ? ?DRAINS: none  ? ?LOCAL MEDICATIONS USED:  MARCAINE    ? ?SPECIMEN:  No Specimen ? ?DISPOSITION OF SPECIMEN:  N/A ? ?COUNTS:  YES ? ?TOURNIQUET:  * No tourniquets in log * ? ?DICTATION: .Dragon Dictation ? ?PLAN OF CARE: Admit for overnight observation ? ?PATIENT DISPOSITION:  PACU - hemodynamically stable. ?  ?

## 2021-04-18 ENCOUNTER — Encounter (HOSPITAL_COMMUNITY): Payer: Self-pay | Admitting: Orthopedic Surgery

## 2021-04-18 NOTE — Discharge Summary (Signed)
? ?Patient ID: ?Eric Washington ?MRN: [id number] ?DOB/AGE: [birth year] 38 y.o. ? ?Admit date: 04/17/2021 ?Discharge date: 04/18/2021 ? ?Admission Diagnoses:  ?Principal Problem: ?  Lumbar spinal stenosis ? ? ?Discharge Diagnoses:  ?Principal Problem: ?  Lumbar spinal stenosis ? status post Procedure(s): ?Lumbar three - four decompression ? ?History reviewed. No pertinent past medical history. ? ?Surgeries: Procedure(s): ?Lumbar three - four decompression on 04/17/2021 ?  ?Consultants:   None ? ?Discharged Condition: Improved ? ?Hospital Course: Eric Washington is an 36 y.o. male who was admitted 04/17/2021 for operative treatment of Lumbar spinal stenosis. Patient failed conservative treatments (please see the history and physical for the specifics) and had severe unremitting pain that affects sleep, daily activities and work/hobbies. After pre-op clearance, the patient was taken to the operating room on 04/17/2021 and underwent  Procedure(s): ?Lumbar three - four decompression.   ? ?Patient was given perioperative antibiotics:  ?Anti-infectives (From admission, onward)  ? ? Start     Dose/Rate Route Frequency Ordered Stop  ? 04/17/21 1800  ceFAZolin (ANCEF) IVPB 1 g/50 mL premix       ? 1 g ?100 mL/hr over 30 Minutes Intravenous Every 8 hours 04/17/21 1353 04/18/21 0238  ? 04/17/21 0727  ceFAZolin (ANCEF) IVPB 2g/100 mL premix       ? 2 g ?200 mL/hr over 30 Minutes Intravenous 30 min pre-op 04/17/21 0727 04/17/21 0956  ? 04/17/21 0702  ceFAZolin (ANCEF) 2-4 GM/100ML-% IVPB       ?Note to Pharmacy: Kandice Hams D: cabinet override  ?    04/17/21 0702 04/17/21 0951  ? ?  ?  ? ?Patient was given sequential compression devices and early ambulation to prevent DVT.  ? ?Patient benefited maximally from hospital stay and there were no complications. At the time of discharge, the patient was urinating/moving their bowels without difficulty, tolerating a regular diet, pain is controlled with oral pain medications and they have been  cleared by PT/OT.  ? ?Recent vital signs: Patient Vitals for the past 24 hrs: ? BP Temp Temp src Pulse Resp SpO2  ?04/18/21 0342 117/67 98.1 ?F (36.7 ?C) Oral 79 18 98 %  ?04/17/21 2300 117/62 98.3 ?F (36.8 ?C) Oral 89 20 98 %  ?04/17/21 1926 120/74 98.2 ?F (36.8 ?C) Oral 94 18 98 %  ?04/17/21 1617 120/67 97.7 ?F (36.5 ?C) Oral 96 16 99 %  ?04/17/21 1359 124/72 98 ?F (36.7 ?C) Oral 81 18 98 %  ?04/17/21 1323 135/78 98 ?F (36.7 ?C) -- 85 15 98 %  ?04/17/21 1308 128/76 -- -- 85 16 98 %  ?04/17/21 1252 135/78 -- -- 92 16 98 %  ?04/17/21 1238 135/81 -- -- (!) 102 16 98 %  ?04/17/21 1223 118/78 99.1 ?F (37.3 ?C) -- (!) 104 13 98 %  ?  ? ?Recent laboratory studies:  ?Recent Labs  ?  04/15/21 ?1500  ?WBC 9.4  ?HGB 14.2  ?HCT 42.8  ?PLT 209  ? ? ? ?Discharge Medications:   ?Allergies as of 04/18/2021   ?No Known Allergies ?  ? ?  ?Medication List  ?  ? ?TAKE these medications   ? ?methocarbamol 500 MG tablet ?Commonly known as: Robaxin ?Take 1 tablet (500 mg total) by mouth every 8 (eight) hours as needed for up to 5 days for muscle spasms. ?  ?ondansetron 4 MG tablet ?Commonly known as: Zofran ?Take 1 tablet (4 mg total) by mouth every 8 (eight) hours as needed for nausea or vomiting. ?  ?  oxyCODONE-acetaminophen 10-325 MG tablet ?Commonly known as: Percocet ?Take 1 tablet by mouth every 6 (six) hours as needed for up to 5 days for pain. ?  ? ?  ? ? ?Diagnostic Studies: DG Lumbar Spine 2-3 Views ? ?Result Date: 04/17/2021 ?CLINICAL DATA:  Operative localization EXAM: LUMBAR SPINE - 2-3 VIEW COMPARISON:  02/25/2021 FINDINGS: Two cross-table lateral views demonstrate operative localization hardware at L3-4. Normal alignment. Mild disc space narrowing at L3-4. This correlates with the MRI. IMPRESSION: Operative localization L3-4. Electronically Signed   By: Judie Petit.  Shick M.D.   On: 04/17/2021 10:31  ? ?DG Lumbar Spine 1 View ? ?Result Date: 04/17/2021 ?CLINICAL DATA:  Intraoperative imaging for L3-L4 decompression EXAM: LUMBAR SPINE - 1  VIEW COMPARISON:  04/17/2021 10:08 a.m. FINDINGS: Single cross-table lateral view demonstrating operative localization hardware at L3-L4. No acute fractures seen normal spinal alignment. IMPRESSION: Operative localization at L3-L4. Electronically Signed   By: Wiliam Ke M.D.   On: 04/17/2021 12:30   ? ?Discharge Instructions   ? ? Incentive spirometry RT   Complete by: As directed ?  ? ?  ? ? ? Follow-up Information   ? ? Venita Lick, MD. Schedule an appointment as soon as possible for a visit in 2 week(s).   ?Specialty: Orthopedic Surgery ?Why: For suture removal, For wound re-check, If symptoms worsen ?Contact information: ?[address] ?[address] ?[place] [address] ?[phone] ? ? ?  ?  ? ?  ?  ? ?  ? ? ?Discharge Plan:  discharge to home ? ?Disposition:  ?Patient is doing so preoperative radicular leg pain has resolved, and patient feels as though he is walking better.  We will plan on working with physical therapy and then discharged home later on.  He will follow-up with me in 2 weeks. ? ?Signed: ?Alvy Beal for Dr. Venita Lick ?[facility] [phone] ?04/18/2021, 7:29 AM ? ? ? ? ? ? ? ?

## 2021-04-18 NOTE — Evaluation (Signed)
Occupational Therapy Evaluation and Discharge Summary ?Patient Details ?Name: Eric Washington ?MRN: [id number] ?DOB: Mar 18, 1985 ?Today's Date: 04/18/2021 ? ? ?History of Present Illness Pt is a 36 yo male admitted with pain in back with radicular leg pain.  Pt underwent a L3-4 decompression.  No pertinent PMH  ? ?Clinical Impression ?  ?Pt admitted for the above diagnosis and has the deficits listed below. Pt overall is ambulating better than admission and is supervision for all adls. Pt has cousins that can assist at home as needed.  Recommend pt have someone with him for the first few days post surgery to assist with home care tasks.  Pt remains mildly unsteady on his feet and has been using a cane lately.  Do not feel pt requires further OT at this time. All precautions reviewed several times with cousin translating.  No further OT needs at this time.  ?  ?   ? ?Recommendations for follow up therapy are one component of a multi-disciplinary discharge planning process, led by the attending physician.  Recommendations may be updated based on patient status, additional functional criteria and insurance authorization.  ? ?Follow Up Recommendations ? No OT follow up  ?  ?Assistance Recommended at Discharge Intermittent Supervision/Assistance  ?Patient can return home with the following A little help with bathing/dressing/bathroom;Assistance with cooking/housework;Assist for transportation ? ?  ?Functional Status Assessment ? Patient has had a recent decline in their functional status and demonstrates the ability to make significant improvements in function in a reasonable and predictable amount of time.  ?Equipment Recommendations ? None recommended by OT  ?  ?Recommendations for Other Services   ? ? ?  ?Precautions / Restrictions Precautions ?Precautions: Back;Fall ?Required Braces or Orthoses: Spinal Brace ?Spinal Brace: Lumbar corset;Applied in sitting position ?Restrictions ?Weight Bearing Restrictions: No  ? ?   ? ?Mobility Bed Mobility ?Overal bed mobility: Modified Independent ?  ?  ?  ?  ?  ?  ?General bed mobility comments: Pt used bed rail to get to full sitting.  Bed was flat ?  ? ?Transfers ?Overall transfer level: Needs assistance ?Equipment used: 1 person hand held assist ?Transfers: Sit to/from Stand ?Sit to Stand: Min guard ?  ?  ?  ?  ?  ?General transfer comment: Pt required min guard to get to full standing ?  ? ?  ?Balance Overall balance assessment: Mild deficits observed, not formally tested ?  ?  ?  ?  ?  ?  ?  ?  ?  ?  ?  ?  ?  ?  ?  ?  ?  ?  ?   ? ?ADL either performed or assessed with clinical judgement  ? ?ADL Overall ADL's : Needs assistance/impaired ?Eating/Feeding: Independent;Sitting ?  ?Grooming: Wash/dry hands;Wash/dry face;Oral care;Applying deodorant;Brushing hair;Set up;Standing ?  ?Upper Body Bathing: Set up;Sitting ?  ?Lower Body Bathing: Supervison/ safety;Sit to/from stand;Cueing for compensatory techniques;Adhering to back precautions ?  ?Upper Body Dressing : Set up;Sitting ?Upper Body Dressing Details (indicate cue type and reason): Pt donned brace independently ?Lower Body Dressing: Minimal assistance;Sit to/from stand ?  ?Toilet Transfer: Supervision/safety;Ambulation;Regular Toilet;Grab bars ?Toilet Transfer Details (indicate cue type and reason): pt has sink next to his toilet at home ?Toileting- Clothing Manipulation and Hygiene: Supervision/safety;Sit to/from stand ?  ?  ?  ?Functional mobility during ADLs: Minimal assistance ?General ADL Comments: Pt required cues for following back precautions and encouragement to try things himself.  ? ? ? ?Vision Baseline Vision/History: 0  No visual deficits ?Ability to See in Adequate Light: 0 Adequate ?Patient Visual Report: No change from baseline ?Vision Assessment?: No apparent visual deficits  ?   ?Perception   ?  ?Praxis   ?  ? ?Pertinent Vitals/Pain Pain Assessment ?Pain Assessment: Faces ?Faces Pain Scale: Hurts little more ?Pain  Location: back ?Pain Descriptors / Indicators: Aching, Grimacing, Operative site guarding ?Pain Intervention(s): Limited activity within patient's tolerance, Repositioned, Patient requesting pain meds-RN notified, Monitored during session  ? ? ? ?Hand Dominance Right ?  ?Extremity/Trunk Assessment Upper Extremity Assessment ?Upper Extremity Assessment: Overall WFL for tasks assessed ?  ?Lower Extremity Assessment ?Lower Extremity Assessment: Defer to PT evaluation ?  ?Cervical / Trunk Assessment ?Cervical / Trunk Assessment: Back Surgery ?  ?Communication Communication ?Communication: Prefers language other than English ?  ?Cognition Arousal/Alertness: Awake/alert ?Behavior During Therapy: [facility] for tasks assessed/performed ?Overall Cognitive Status: Within Functional Limits for tasks assessed ?  ?  ?  ?  ?  ?  ?  ?  ?  ?  ?  ?  ?  ?  ?  ?  ?  ?  ?  ?General Comments  Pt limited with ambulation due to mild balance deficits. Pt has cane in room.  Pt to see PT after OT to determine if cane is needed post surgery. ? ?  ?Exercises   ?  ?Shoulder Instructions    ? ? ?Home Living Family/patient expects to be discharged to:: Private residence ?Living Arrangements: Non-relatives/Friends;Other (Comment) (wife's cousin and friends) ?Available Help at Discharge: Available 24 hours/day;Family;Friend(s) ?Type of Home: House ?Home Access: Stairs to enter ?Entrance Stairs-Number of Steps: 4 ?Entrance Stairs-Rails: None ?Home Layout: One level ?  ?  ?Bathroom Shower/Tub: Tub/shower unit;Curtain ?  ?Bathroom Toilet: Standard ?  ?  ?Home Equipment: Gilmer Mor - single point ?  ?  ?  ? ?  ?Prior Functioning/Environment Prior Level of Function : Independent/Modified Independent;Driving ?  ?  ?  ?  ?  ?  ?Mobility Comments: used a cane prior to surgery.  Drives. Works Holiday representative. Wife lives in [place]. ?ADLs Comments: was mod I with all adls, cooking and cleaning ?  ? ?  ?  ?OT Problem List: Impaired balance (sitting and/or standing) ?  ?    ?OT Treatment/Interventions:    ?  ?OT Goals(Current goals can be found in the care plan section) Acute Rehab OT Goals ?Patient Stated Goal: to go home ?OT Goal Formulation: All assessment and education complete, DC therapy  ?OT Frequency:   ?  ? ?Co-evaluation   ?  ?  ?  ?  ? ?  ?AM-PAC OT "6 Clicks" Daily Activity     ?Outcome Measure Help from another person eating meals?: None ?  ?Help from another person toileting, which includes using toliet, bedpan, or urinal?: A Little ?Help from another person bathing (including washing, rinsing, drying)?: A Little ?Help from another person to put on and taking off regular upper body clothing?: None ?Help from another person to put on and taking off regular lower body clothing?: A Little ?6 Click Score: 17 ?  ?End of Session Equipment Utilized During Treatment: Back brace ?Nurse Communication: Mobility status ? ?Activity Tolerance: Patient tolerated treatment well ?Patient left: in bed;with call bell/phone within reach;with family/visitor present ? ?OT Visit Diagnosis: Unsteadiness on feet (R26.81)  ?              ?Time: 1884-1660 ?OT Time Calculation (min): 34 min ?Charges:  OT General Charges ?$OT  Visit: 1 Visit ?OT Evaluation ?$OT Eval Low Complexity: 1 Low ?OT Treatments ?$Self Care/Home Management : 8-22 mins ? ?Hope BuddsJones, Chery Giusto Anne ?04/18/2021, 8:51 AM ?

## 2021-04-18 NOTE — Plan of Care (Signed)
?  Problem: Education: Goal: Ability to verbalize activity precautions or restrictions will improve Outcome: Completed/Met Goal: Knowledge of the prescribed therapeutic regimen will improve Outcome: Completed/Met Goal: Understanding of discharge needs will improve Outcome: Completed/Met   Problem: Activity: Goal: Ability to avoid complications of mobility impairment will improve Outcome: Completed/Met Goal: Ability to tolerate increased activity will improve Outcome: Completed/Met Goal: Will remain free from falls Outcome: Completed/Met   Problem: Bowel/Gastric: Goal: Gastrointestinal status for postoperative course will improve Outcome: Completed/Met   Problem: Clinical Measurements: Goal: Ability to maintain clinical measurements within normal limits will improve Outcome: Completed/Met Goal: Postoperative complications will be avoided or minimized Outcome: Completed/Met Goal: Diagnostic test results will improve Outcome: Completed/Met   Problem: Pain Management: Goal: Pain level will decrease Outcome: Completed/Met   Problem: Skin Integrity: Goal: Will show signs of wound healing Outcome: Completed/Met   Problem: Health Behavior/Discharge Planning: Goal: Identification of resources available to assist in meeting health care needs will improve Outcome: Completed/Met   Problem: Bladder/Genitourinary: Goal: Urinary functional status for postoperative course will improve Outcome: Completed/Met   Problem: Safety: Goal: Ability to remain free from injury will improve Outcome: Completed/Met   

## 2021-04-18 NOTE — Progress Notes (Signed)
Patient awaiting transport via wheelchair by volunteer for discharge to home; with complaints of mild pain on his incision site and was medicated recently for it; incision on his back with honeycomb dressing on and is clean, dry and intact; room was checked and accounted for all his belongings; discharge instructions concerning his medications, incision care, follow up appointment and when to call the doctor as needed were all read in [place] language with patient and cousin by RN and both expressed understanding on the instructions given.  ?  ?  ?  ? ?

## 2021-04-18 NOTE — Evaluation (Signed)
Physical Therapy Evaluation & Discharge ?Patient Details ?Name: Eric Washington ?MRN: [id number] ?DOB: 01-13-1986 ?Today's Date: 04/18/2021 ? ?History of Present Illness ? 36 y/o male admitted on 04/17/21 following L3-4 decompression and bilateral medial facetectomy L3-4. No PMH.  ?Clinical Impression ? Patient admitted following above procedure. Patient ambulating at supervision level with use of cane and min guard-minA without AD. Encouraged use of cane for mobility for stability. Patient able to safely negotiate stairs with cane and supervision to access home environment. Reinforced back precautions with patient able to recall 2/3 from OT session. Educated patient on progressive walking program, patient verbalized understanding. No further skilled PT needs required acutely. No PT follow up recommended at this time. Patient may benefit from OPPT once cleared from back precautions.    ?   ? ?Recommendations for follow up therapy are one component of a multi-disciplinary discharge planning process, led by the attending physician.  Recommendations may be updated based on patient status, additional functional criteria and insurance authorization. ? ?Follow Up Recommendations No PT follow up ? ?  ?Assistance Recommended at Discharge Intermittent Supervision/Assistance  ?Patient can return home with the following ?   ? ?  ?Equipment Recommendations None recommended by PT  ?Recommendations for Other Services ?    ?  ?Functional Status Assessment Patient has had a recent decline in their functional status and demonstrates the ability to make significant improvements in function in a reasonable and predictable amount of time.  ? ?  ?Precautions / Restrictions Precautions ?Precautions: Back;Fall ?Required Braces or Orthoses: Spinal Brace ?Spinal Brace: Lumbar corset;Applied in sitting position ?Restrictions ?Weight Bearing Restrictions: No  ? ?  ? ?Mobility ? Bed Mobility ?Overal bed mobility: Modified Independent ?  ?  ?  ?  ?   ?  ?  ?  ? ?Transfers ?Overall transfer level: Needs assistance ?Equipment used: Straight cane ?Transfers: Sit to/from Stand ?Sit to Stand: Supervision ?  ?  ?  ?  ?  ?General transfer comment: supervision for safety ?  ? ?Ambulation/Gait ?Ambulation/Gait assistance: Supervision ?Gait Distance (Feet): 300 Feet ?Assistive device: Straight cane ?Gait Pattern/deviations: Step-through pattern, Decreased stride length, Antalgic ?Gait velocity: decreased ?  ?  ?General Gait Details: supervision for safety due to unsteadiness. Patient attempting to ambulate without AD but unsteady and required min guard-minA for balance. Encouraged use of cane for now until patient feels more steady on his feet. ? ?Stairs ?Stairs: Yes ?Stairs assistance: Supervision ?Stair Management: Step to pattern, Forwards, With cane ?Number of Stairs: 4 ?General stair comments: supervision for safety. Instructed patient on up with stronger LE and down with weaker leg ? ?Wheelchair Mobility ?  ? ?Modified Rankin (Stroke Patients Only) ?  ? ?  ? ?Balance Overall balance assessment: Mild deficits observed, not formally tested ?  ?  ?  ?  ?  ?  ?  ?  ?  ?  ?  ?  ?  ?  ?  ?  ?  ?  ?   ? ? ? ?Pertinent Vitals/Pain Pain Assessment ?Pain Assessment: Faces ?Faces Pain Scale: Hurts little more ?Pain Location: back ?Pain Descriptors / Indicators: Aching, Grimacing, Operative site guarding ?Pain Intervention(s): Monitored during session  ? ? ?Home Living Family/patient expects to be discharged to:: Private residence ?Living Arrangements: Non-relatives/Friends;Other (Comment) (wife's cousin and friends) ?Available Help at Discharge: Available 24 hours/day;Family;Friend(s) ?Type of Home: House ?Home Access: Stairs to enter ?Entrance Stairs-Rails: None ?Entrance Stairs-Number of Steps: 4 ?  ?Home Layout: One level ?Home  Equipment: [place] [employer] - single point ?   ?  ?Prior Function Prior Level of Function : Independent/Modified Independent;Driving ?  ?  ?  ?  ?  ?   ?Mobility Comments: used a cane prior to surgery.  Drives. Works [employer] representative. Wife lives in [place]. ?ADLs Comments: was mod I with all adls, cooking and cleaning ?  ? ? ?Hand Dominance  ? Dominant Hand: Right ? ?  ?Extremity/Trunk Assessment  ? Upper Extremity Assessment ?Upper Extremity Assessment: Defer to OT evaluation ?  ? ?Lower Extremity Assessment ?Lower Extremity Assessment: Generalized weakness (R>L) ?  ? ?Cervical / Trunk Assessment ?Cervical / Trunk Assessment: Back Surgery  ?Communication  ? Communication: Prefers language other than English  ?Cognition Arousal/Alertness: Awake/alert ?Behavior During Therapy: [facility] for tasks assessed/performed ?Overall Cognitive Status: Within Functional Limits for tasks assessed ?  ?  ?  ?  ?  ?  ?  ?  ?  ?  ?  ?  ?  ?  ?  ?  ?  ?  ?  ? ?  ?General Comments General comments (skin integrity, edema, etc.): Pt limited with ambulation due to mild balance deficits. Pt has cane in room.  Pt to see PT after OT to determine if cane is needed post surgery. ? ?  ?Exercises    ? ?Assessment/Plan  ?  ?PT Assessment Patient does not need any further PT services  ?PT Problem List   ? ?   ?  ?PT Treatment Interventions     ? ?PT Goals (Current goals can be found in the Care Plan section)  ?Acute Rehab PT Goals ?Patient Stated Goal: to go home ?PT Goal Formulation: All assessment and education complete, DC therapy ? ?  ?Frequency   ?  ? ? ?Co-evaluation   ?  ?  ?  ?  ? ? ?  ?AM-PAC PT "6 Clicks" Mobility  ?Outcome Measure Help needed turning from your back to your side while in a flat bed without using bedrails?: None ?Help needed moving from lying on your back to sitting on the side of a flat bed without using bedrails?: None ?Help needed moving to and from a bed to a chair (including a wheelchair)?: A Little ?Help needed standing up from a chair using your arms (e.g., wheelchair or bedside chair)?: A Little ?Help needed to walk in hospital room?: A Little ?Help needed climbing 3-5  steps with a railing? : A Little ?6 Click Score: 20 ? ?  ?End of Session Equipment Utilized During Treatment: Back brace ?Activity Tolerance: Patient tolerated treatment well ?Patient left: in bed;with call bell/phone within reach ?Nurse Communication: Mobility status ?PT Visit Diagnosis: Unsteadiness on feet (R26.81);Muscle weakness (generalized) (M62.81) ?  ? ?Time: 1829-9371 ?PT Time Calculation (min) (ACUTE ONLY): 14 min ? ? ?Charges:   PT Evaluation ?$PT Eval Low Complexity: 1 Low ?  ?  ?   ? ? ?Nirav Sweda A. Dan Humphreys, PT, DPT ?Acute Rehabilitation Services ?Pager [phone] ?Office [phone] ? ? ?Tamu Golz A Aima Mcwhirt ?04/18/2021, 9:13 AM ? ?

## 2023-04-22 IMAGING — CR DG LUMBAR SPINE 2-3V
2 series · 2 of 2 positions shown · non-contrast
Comparison: 02/25/2021

CLINICAL DATA: Operative localization

EXAM:
LUMBAR SPINE - 2-3 VIEW

[lateral (1 of 2)]
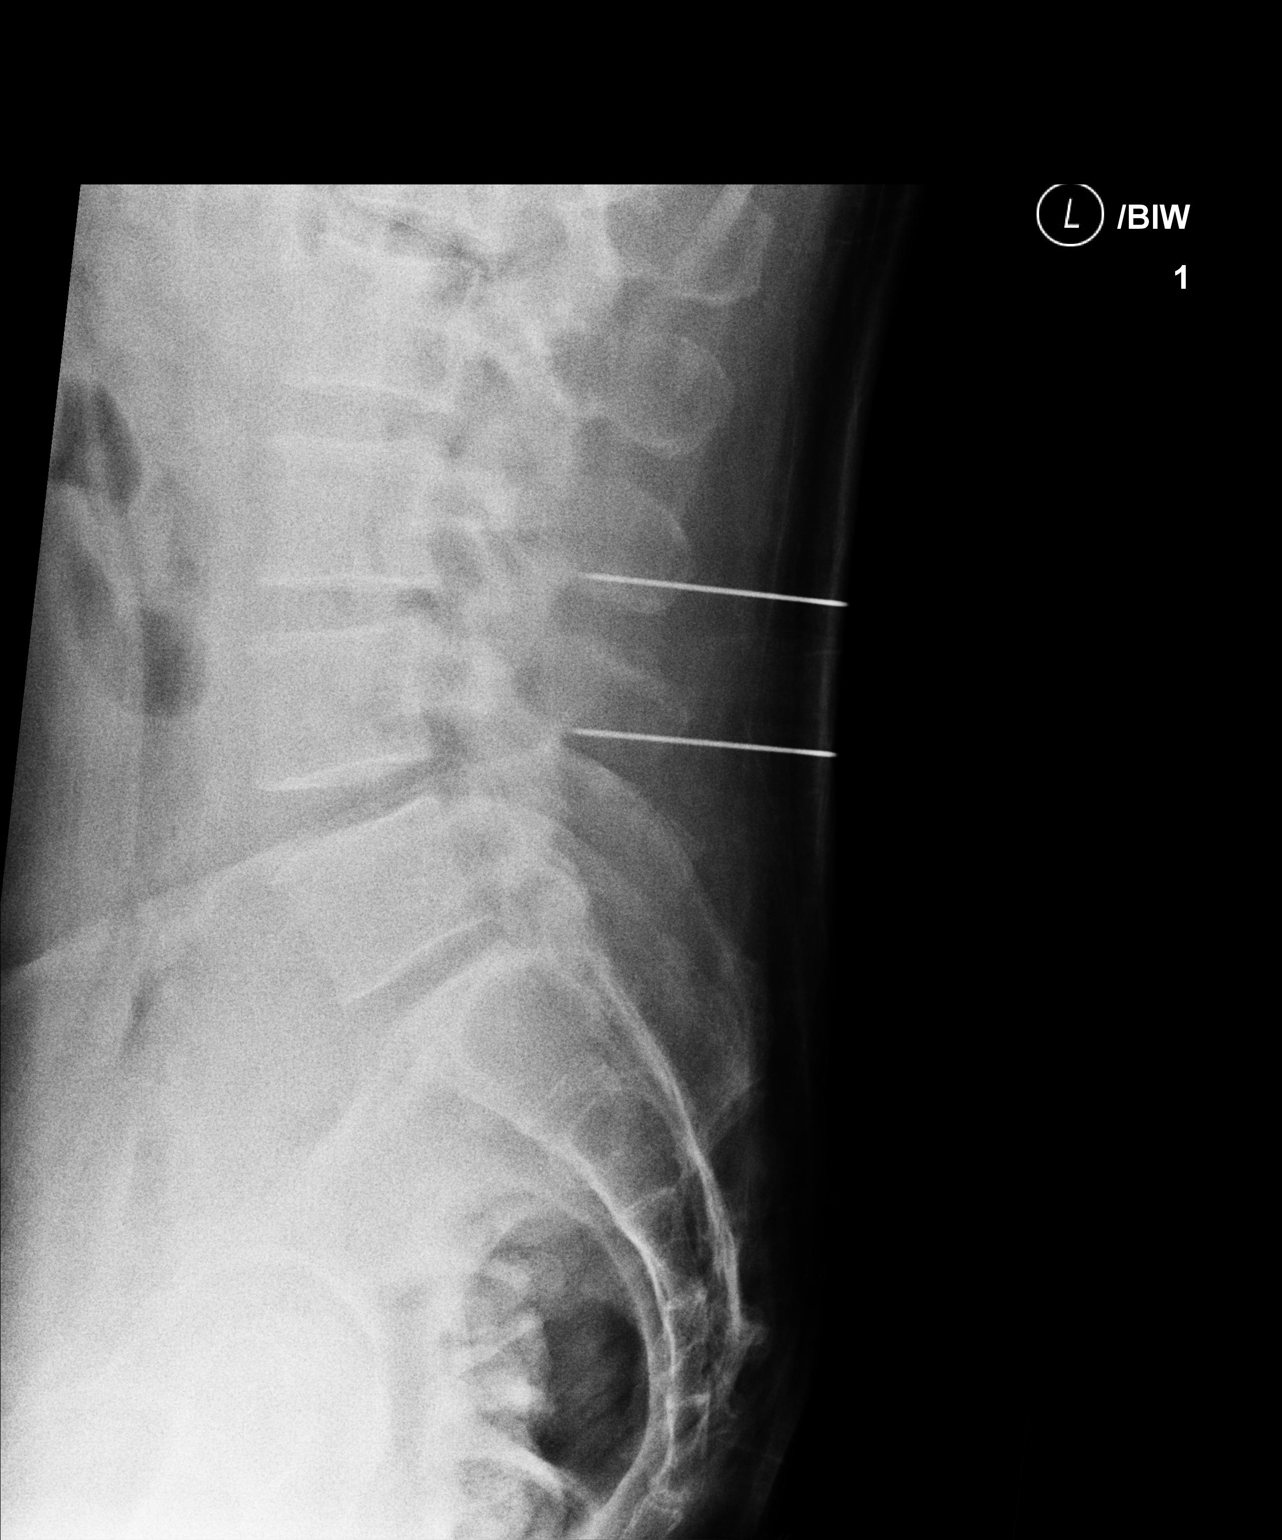

[lateral (2 of 2)]
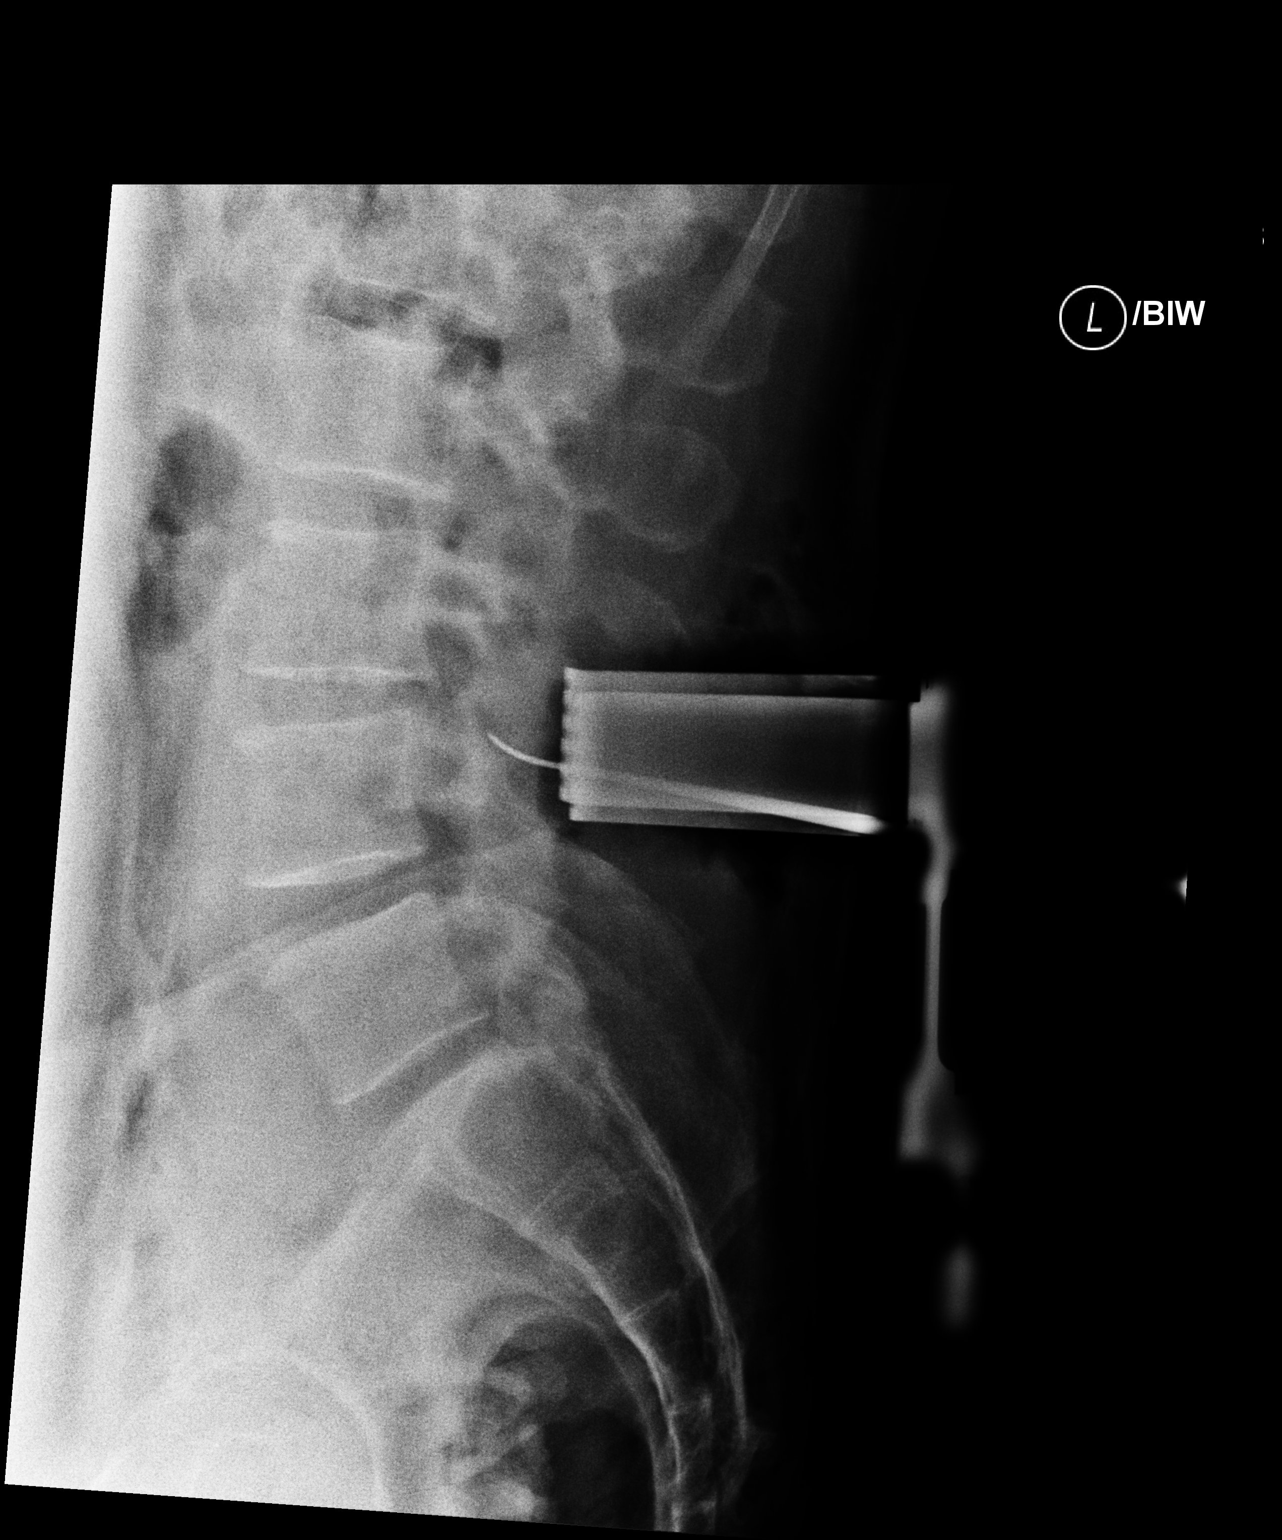

[2 of 2 positions shown; findings below may reference images not displayed]

FINDINGS: Two cross-table lateral views demonstrate operative localization
hardware at L3-4. Normal alignment. Mild disc space narrowing at
L3-4. This correlates with the MRI.
IMPRESSION: Operative localization L3-4.
